# Patient Record
Sex: Male | Born: 1950 | Race: Black or African American | Hispanic: No | Marital: Married | Smoking: Current every day smoker
Health system: Southern US, Community
[De-identification: ages and names within clinical notes are randomized; demographics above are authoritative.]

## PROBLEM LIST (undated history)

## (undated) DIAGNOSIS — R7303 Prediabetes: Secondary | ICD-10-CM

---

## 2019-01-02 ENCOUNTER — Other Ambulatory Visit: Payer: Self-pay | Admitting: Family Medicine

## 2019-01-02 ENCOUNTER — Other Ambulatory Visit (HOSPITAL_COMMUNITY): Payer: Self-pay | Admitting: Family Medicine

## 2019-01-02 DIAGNOSIS — R911 Solitary pulmonary nodule: Secondary | ICD-10-CM

## 2019-01-09 ENCOUNTER — Other Ambulatory Visit: Payer: Self-pay

## 2019-01-09 ENCOUNTER — Ambulatory Visit (HOSPITAL_COMMUNITY): Payer: Medicare Other

## 2019-01-09 ENCOUNTER — Ambulatory Visit (HOSPITAL_COMMUNITY)
Admission: RE | Admit: 2019-01-09 | Discharge: 2019-01-09 | Disposition: A | Discharge status: Home / Self Care | Payer: Medicare Other | Source: Ambulatory Visit | Attending: Family Medicine | Admitting: Family Medicine

## 2019-01-09 DIAGNOSIS — R911 Solitary pulmonary nodule: Secondary | ICD-10-CM | POA: Diagnosis not present

## 2021-09-28 ENCOUNTER — Other Ambulatory Visit: Payer: Self-pay

## 2021-09-28 ENCOUNTER — Ambulatory Visit (INDEPENDENT_AMBULATORY_CARE_PROVIDER_SITE_OTHER): Payer: Medicare Other | Admitting: Emergency Medicine

## 2021-09-28 ENCOUNTER — Encounter: Payer: Self-pay | Admitting: Emergency Medicine

## 2021-09-28 DIAGNOSIS — Z720 Tobacco use: Secondary | ICD-10-CM

## 2021-09-28 DIAGNOSIS — R911 Solitary pulmonary nodule: Secondary | ICD-10-CM

## 2021-09-28 NOTE — Assessment & Plan Note (Signed)
6 mm right upper lobe pulmonary nodule that was seen 12/2018.  No follow-up has been done yet.  Plan for repeat CT now.  If it has evolved then would recommend navigational bronchoscopy to further evaluate.  Discussed this with him today.  If the nodule is stable then would transition him over into the lung cancer screening program.

## 2021-09-28 NOTE — Progress Notes (Signed)
Subjective:    Patient ID: Kevin Dixon, male    DOB: 07/07/1951, 71 y.o.   MRN: [id number]  HPI 71 year old active smoker (50 pack years) with a history of diabetes, hypertension, vitamin D deficiency, hyperlipidemia. No SOB, no wheeze. His wife says he coughs frequently. Occasional mucous. Never hemoptysis.   CT chest 01/09/2019 reviewed by me shows no mediastinal or hilar lymphadenopathy.  There was a 6 mm right upper lobe pulmonary nodule with an adjacent small 2 mm nodule.  No other nodule seen.  There was some reticulation, fibrotic change in the anterior left upper lobe with some associated bronchiectasis.   Review of Systems As per HPI  No past medical history on file.  Diabetes Vitamin D deficiency Hyperlipidemia Tobacco use  No family history on file.   Social History   Socioeconomic History   Marital status: Married    Spouse name: Not on file   Number of children: Not on file   Years of education: Not on file   Highest education level: Not on file  Occupational History   Not on file  Tobacco Use   Smoking status: Every Day    Packs/day: 0.50    Years: 30.00    Pack years: 15.00    Types: Cigarettes   Smokeless tobacco: Never   Tobacco comments:    1/2 pack smoking daily ARJ 09/28/21  Substance and Sexual Activity   Alcohol use: Not on file   Drug use: Not on file   Sexual activity: Not on file  Other Topics Concern   Not on file  Social History Narrative   Not on file   Social Determinants of Health   Financial Resource Strain: Not on file  Food Insecurity: Not on file  Transportation Needs: Not on file  Physical Activity: Not on file  Stress: Not on file  Social Connections: Not on file  Intimate Partner Violence: Not on file    Was in the [employer], [employer], no asbestos  Was in [place].  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  Never TB, no TB exposure.   Not on File   Outpatient Medications Prior to Visit   Medication Sig Dispense Refill   naproxen (NAPROSYN) 375 MG tablet SMARTSIG:1 Tablet(s) By Mouth Every 12 Hours PRN     No facility-administered medications prior to visit.         Objective:   Physical Exam Vitals:   09/28/21 1042  BP: 112/68  Pulse: 77  Temp: 98.3 F (36.8 C)  TempSrc: Oral  SpO2: 97%  Weight: 168 lb 6.4 oz (76.4 kg)  Height: 5\' 9"  (1.753 m)   Gen: Pleasant, thin, in no distress,  normal affect  ENT: No lesions,  mouth clear,  oropharynx clear, no postnasal drip  Neck: No JVD, no stridor  Lungs: No use of accessory muscles, inspiratory squeaks, no wheezes or crackles  Cardiovascular: RRR, heart sounds normal, no murmur or gallops, no peripheral edema  Musculoskeletal: No deformities, no cyanosis or clubbing  Neuro: alert, awake, non focal  Skin: Warm, no lesions or rash      Assessment & Plan:  Pulmonary nodule 6 mm right upper lobe pulmonary nodule that was seen 12/2018.  No follow-up has been done yet.  Plan for repeat CT now.  If it has evolved then would recommend navigational bronchoscopy to further evaluate.  Discussed this with him today.  If the nodule is stable then would transition him over into  the lung cancer screening program.  Tobacco use Discussed cessation with him today.  He understands that he needs to cut down ultimately quit.  He is in the contemplative phase At risk for COPD although he minimizes symptoms.  His wife states he does cough.  We will plan to get pulmonary function testing at some point going forward.  This may help guide [place] regarding bronchoscopy versus referral for primary resection depending on how the nodule behaves.   Levy Pupa, MD, PhD 09/28/2021, 11:05 AM [facility] [phone] or if no answer before 7:00PM call [phone] For any issues after 7:00PM please call eLink [phone]

## 2021-09-28 NOTE — Assessment & Plan Note (Addendum)
Discussed cessation with him today.  He understands that he needs to cut down ultimately quit.  He is in the contemplative phase At risk for COPD although he minimizes symptoms.  His wife states he does cough.  We will plan to get pulmonary function testing at some point going forward.  This may help guide [place] regarding bronchoscopy versus referral for primary resection depending on how the nodule behaves.

## 2021-09-28 NOTE — Patient Instructions (Signed)
We will plan to repeat your CT scan of the chest to compare with your prior.  We can do this at [facility] in [place]. You will probably benefit from having pulmonary function testing at some point in the future to look for any evidence of COPD. Work on decreasing your cigarettes.  Ultimate goal will be to stop altogether. Follow-up with Dr. Delton Coombes next available after your CT so we can review the results together.

## 2021-09-28 NOTE — Addendum Note (Signed)
Addended by: Dorisann Frames R on: 09/28/2021 11:57 AM   Modules accepted: Orders

## 2021-11-01 ENCOUNTER — Ambulatory Visit (HOSPITAL_COMMUNITY)
Admission: RE | Admit: 2021-11-01 | Discharge: 2021-11-01 | Disposition: A | Discharge status: Home / Self Care | Payer: Medicare Other | Source: Ambulatory Visit | Attending: Emergency Medicine | Admitting: Emergency Medicine

## 2021-11-01 ENCOUNTER — Other Ambulatory Visit: Payer: Self-pay

## 2021-11-01 DIAGNOSIS — R911 Solitary pulmonary nodule: Secondary | ICD-10-CM | POA: Diagnosis present

## 2021-11-02 ENCOUNTER — Encounter: Payer: Self-pay | Admitting: Emergency Medicine

## 2021-11-02 ENCOUNTER — Ambulatory Visit (INDEPENDENT_AMBULATORY_CARE_PROVIDER_SITE_OTHER): Payer: Medicare Other | Admitting: Emergency Medicine

## 2021-11-02 DIAGNOSIS — Z720 Tobacco use: Secondary | ICD-10-CM

## 2021-11-02 DIAGNOSIS — R911 Solitary pulmonary nodule: Secondary | ICD-10-CM | POA: Diagnosis not present

## 2021-11-02 NOTE — Addendum Note (Signed)
Addended by: Arlyss Repress on: 11/02/2021 11:57 AM ? ? Modules accepted: Orders ? ?

## 2021-11-02 NOTE — Patient Instructions (Signed)
We will plan to repeat your CT scan of the chest without contrast in 6 months.  If the radiology report deems that your nodule is unchanged then we will extend this out to 12 months as discussed today. ?We will hold off on pulmonary function testing or inhalers for now.  If your breathing changes then it would be reasonable for [place] to pursue trying an inhaler. ?Work on decreasing your cigarettes.  Ultimate goal would be to stop altogether. ?Follow with Dr. Delton Coombes in 6 months ?

## 2021-11-02 NOTE — Progress Notes (Signed)
? ?Subjective:  ? ? Patient ID: Kevin Dixon, male    DOB: 07-26-1951, 71 y.o.   MRN: [id number] ? ?HPI ?71 year old active smoker (50 pack years) with a history of diabetes, hypertension, vitamin D deficiency, hyperlipidemia. No SOB, no wheeze. His wife says he coughs frequently. Occasional mucous. Never hemoptysis.  ? ?CT chest 01/09/2019 reviewed by me shows no mediastinal or hilar lymphadenopathy.  There was a 6 mm right upper lobe pulmonary nodule with an adjacent small 2 mm nodule.  No other nodule seen.  There was some reticulation, fibrotic change in the anterior left upper lobe with some associated bronchiectasis. ? ?ROV 11/02/21 --follow-up visit for 71 year old gentleman with a history of active tobacco use.  I saw him 1 month ago for an abnormal CT scan of the chest done in May 2020.  Feels well, denies dyspnea, does not use albuterol and is not interested in pulmonary function testing or an everyday inhaler.  We repeated his CT scan of the chest to compare with 12/2018 as below. ? ?Super D CT chest 11/01/2021 reviewed by me, shows the right upper lobe nodule still present, appears to be grossly unchanged in size although there may be a small linear component present that I did not see on the previous scan from 12/2018.  Minimal interval change if any.  The radiology report is still pending ? ? ?Review of Systems ?As per HPI ? ?No past medical history on file.  ?Diabetes ?Vitamin D deficiency ?Hyperlipidemia ?Tobacco use ? ?No family history on file.  ? ?Social History  ? ?Socioeconomic History  ? Marital status: Married  ?  Spouse name: Not on file  ? Number of children: Not on file  ? Years of education: Not on file  ? Highest education level: Not on file  ?Occupational History  ? Not on file  ?Tobacco Use  ? Smoking status: Every Day  ?  Packs/day: 0.50  ?  Years: 30.00  ?  Pack years: 15.00  ?  Types: Cigarettes  ? Smokeless tobacco: Never  ? Tobacco comments:  ?  1/2 pack smoking daily ARJ 09/28/21   ?Substance and Sexual Activity  ? Alcohol use: Not on file  ? Drug use: Not on file  ? Sexual activity: Not on file  ?Other Topics Concern  ? Not on file  ?Social History Narrative  ? Not on file  ? ?Social Determinants of Health  ? ?Financial Resource Strain: Not on file  ?Food Insecurity: Not on file  ?Transportation Needs: Not on file  ?Physical Activity: Not on file  ?Stress: Not on file  ?Social Connections: Not on file  ?Intimate Partner Violence: Not on file  ?  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  ?Worked in [employer], and then ran [employer], did wear a mask.  ?Never TB, no TB exposure.  ? ?Not on File  ? ?Outpatient Medications Prior to Visit  ?Medication Sig Dispense Refill  ? naproxen (NAPROSYN) 375 MG tablet SMARTSIG:1 Tablet(s) By Mouth Every 12 Hours PRN (Patient not taking: Reported on 11/02/2021)    ? ?No facility-administered medications prior to visit.  ? ? ? ? ?   ?Objective:  ? Physical Exam ?Vitals:  ? 11/02/21 1119  ?BP: 122/62  ?Pulse: 68  ?Temp: 98.4 ?F (36.9 ?C)  ?TempSrc: Oral  ?SpO2: 99%  ?Weight: 172 lb 3.2 oz (78.1 kg)  ?Height: 5\' 9"  (1.753 m)  ? ?Gen: Pleasant, thin, in no distress,  normal  affect ? ?ENT: No lesions,  mouth clear,  oropharynx clear, no postnasal drip ? ?Neck: No JVD, no stridor ? ?Lungs: No use of accessory muscles, inspiratory squeaks, no wheezes or crackles ? ?Cardiovascular: RRR, heart sounds normal, no murmur or gallops, no peripheral edema ? ?Musculoskeletal: No deformities, no cyanosis or clubbing ? ?Neuro: alert, awake, non focal ? ?Skin: Warm, no lesions or rash ? ? ?   ?Assessment & Plan:  ?Pulmonary nodule ?CT from yesterday.  Radiology report still pending.  The nodule may be slightly larger, measures at 8.6 mm which is the same as 12/2018.  There is also a subtle linear component, spiculation present on the new scan that I do not see on the old scan.  I discussed possible get a PET scan with him although acknowledge that  sensitivity will be lowered due to the nodule size.  He wants to hold off on this.  He is willing to have a repeat CT.  I recommended 6 months.  He would like to extend it to 12 months if the radiology report confirms that the right upper lobe nodule is unchanged.  We can discuss this depending on the radiology interpretation. ? ?Tobacco use ?Needs cessation.  Discussed today.  He wants to hold off on PFT or bronchodilator therapy for now.  If we ultimately decide that he would potentially be a candidate for surgical resection of his pulmonary nodule and clearly he will need PFT.  If he develops symptoms then we can talk about PFT and bronchodilators. ? ? ?Levy Pupa, MD, PhD ?11/02/2021, 11:52 AM ?[facility] ?[phone] or if no answer before 7:00PM call [phone] ?For any issues after 7:00PM please call eLink [phone] ? ? ?

## 2021-11-02 NOTE — Assessment & Plan Note (Signed)
CT from yesterday.  Radiology report still pending.  The nodule may be slightly larger, measures at 8.6 mm which is the same as 12/2018.  There is also a subtle linear component, spiculation present on the new scan that I do not see on the old scan.  I discussed possible get a PET scan with him although acknowledge that sensitivity will be lowered due to the nodule size.  He wants to hold off on this.  He is willing to have a repeat CT.  I recommended 6 months.  He would like to extend it to 12 months if the radiology report confirms that the right upper lobe nodule is unchanged.  We can discuss this depending on the radiology interpretation. ?

## 2021-11-02 NOTE — Assessment & Plan Note (Signed)
Needs cessation.  Discussed today.  He wants to hold off on PFT or bronchodilator therapy for now.  If we ultimately decide that he would potentially be a candidate for surgical resection of his pulmonary nodule and clearly he will need PFT.  If he develops symptoms then we can talk about PFT and bronchodilators. ?

## 2021-11-09 ENCOUNTER — Telehealth: Payer: Self-pay | Admitting: Emergency Medicine

## 2021-11-09 NOTE — Telephone Encounter (Signed)
Called and spoke with patient's wife Corrie Dandy ([place]).  Corrie Dandy stated patient had CT 11/01/21 and Dr. Delton Coombes went over what he saw in chest CT, but was waiting on radiology report.  Corrie Dandy is wanting Dr. Kavin Leech results after seeing CT and reading radiology report. ? ? ?Message routed to Dr. Delton Coombes to advise ?

## 2021-11-09 NOTE — Telephone Encounter (Signed)
Patient's spouse, Mary(DPR) is aware of below results and voiced her understanding.  ?CT results faxed to PCP per [facility] results.  ?Nothing further needed.  ? ?

## 2021-11-09 NOTE — Telephone Encounter (Signed)
Please let them know that the radiologist agreed that the nodules looked stable. Agree with conservative management, follow up CT as we had planned.  ?

## 2022-04-28 ENCOUNTER — Ambulatory Visit (HOSPITAL_COMMUNITY): Payer: Medicare Other

## 2022-05-04 ENCOUNTER — Ambulatory Visit: Payer: Medicare Other | Admitting: Emergency Medicine

## 2022-05-05 ENCOUNTER — Ambulatory Visit (HOSPITAL_COMMUNITY): Payer: Medicare Other

## 2022-05-10 ENCOUNTER — Ambulatory Visit: Payer: Medicare Other | Admitting: Emergency Medicine

## 2022-05-11 ENCOUNTER — Emergency Department (HOSPITAL_COMMUNITY)
Admission: EM | Admit: 2022-05-11 | Discharge: 2022-05-11 | Disposition: A | Discharge status: Home / Self Care | Payer: Medicare Other | Attending: Emergency Medicine | Admitting: Emergency Medicine

## 2022-05-11 ENCOUNTER — Emergency Department (HOSPITAL_COMMUNITY): Payer: Medicare Other

## 2022-05-11 ENCOUNTER — Encounter (HOSPITAL_COMMUNITY): Payer: Self-pay | Admitting: *Deleted

## 2022-05-11 ENCOUNTER — Other Ambulatory Visit: Payer: Self-pay

## 2022-05-11 DIAGNOSIS — H538 Other visual disturbances: Secondary | ICD-10-CM | POA: Diagnosis not present

## 2022-05-11 DIAGNOSIS — D649 Anemia, unspecified: Secondary | ICD-10-CM

## 2022-05-11 DIAGNOSIS — Z7901 Long term (current) use of anticoagulants: Secondary | ICD-10-CM | POA: Diagnosis not present

## 2022-05-11 DIAGNOSIS — Z7982 Long term (current) use of aspirin: Secondary | ICD-10-CM | POA: Insufficient documentation

## 2022-05-11 DIAGNOSIS — Z79899 Other long term (current) drug therapy: Secondary | ICD-10-CM | POA: Insufficient documentation

## 2022-05-11 DIAGNOSIS — H5462 Unqualified visual loss, left eye, normal vision right eye: Secondary | ICD-10-CM

## 2022-05-11 HISTORY — DX: Prediabetes: R73.03

## 2022-05-11 LAB — CBC
HCT: 30.8 % — ABNORMAL LOW (ref 39.0–52.0)
Hemoglobin: 9.4 g/dL — ABNORMAL LOW (ref 13.0–17.0)
MCH: 25.4 pg — ABNORMAL LOW (ref 26.0–34.0)
MCHC: 30.5 g/dL (ref 30.0–36.0)
MCV: 83.2 fL (ref 80.0–100.0)
Platelets: 236 10*3/uL (ref 150–400)
RBC: 3.7 MIL/uL — ABNORMAL LOW (ref 4.22–5.81)
RDW: 19.7 % — ABNORMAL HIGH (ref 11.5–15.5)
WBC: 8.7 10*3/uL (ref 4.0–10.5)
nRBC: 0.2 % (ref 0.0–0.2)

## 2022-05-11 LAB — COMPREHENSIVE METABOLIC PANEL
ALT: 12 U/L (ref 0–44)
AST: 10 U/L — ABNORMAL LOW (ref 15–41)
Albumin: 3.2 g/dL — ABNORMAL LOW (ref 3.5–5.0)
Alkaline Phosphatase: 61 U/L (ref 38–126)
Anion gap: 8 (ref 5–15)
BUN: 21 mg/dL (ref 8–23)
CO2: 27 mmol/L (ref 22–32)
Calcium: 8.9 mg/dL (ref 8.9–10.3)
Chloride: 102 mmol/L (ref 98–111)
Creatinine, Ser: 0.91 mg/dL (ref 0.61–1.24)
GFR, Estimated: >60 mL/min (ref >60)
Glucose, Bld: 160 mg/dL — ABNORMAL HIGH (ref 70–99)
Potassium: 4.6 mmol/L (ref 3.5–5.1)
Sodium: 137 mmol/L (ref 135–145)
Total Bilirubin: 0.7 mg/dL (ref 0.3–1.2)
Total Protein: 8.1 g/dL (ref 6.5–8.1)

## 2022-05-11 LAB — URINALYSIS, ROUTINE W REFLEX MICROSCOPIC
Bacteria, UA: NONE SEEN
Bilirubin Urine: NEGATIVE
Glucose, UA: NEGATIVE mg/dL
Ketones, ur: NEGATIVE mg/dL
Leukocytes,Ua: NEGATIVE
Nitrite: NEGATIVE
Protein, ur: NEGATIVE mg/dL
Specific Gravity, Urine: 1.044 — ABNORMAL HIGH (ref 1.005–1.030)
pH: 5 (ref 5.0–8.0)

## 2022-05-11 LAB — APTT: aPTT: 115 seconds — ABNORMAL HIGH (ref 24–36)

## 2022-05-11 LAB — DIFFERENTIAL
Abs Immature Granulocytes: 0.16 10*3/uL — ABNORMAL HIGH (ref 0.00–0.07)
Basophils Absolute: 0.2 10*3/uL — ABNORMAL HIGH (ref 0.0–0.1)
Basophils Relative: 2 %
Eosinophils Absolute: 0.2 10*3/uL (ref 0.0–0.5)
Eosinophils Relative: 2 %
Immature Granulocytes: 2 %
Lymphocytes Relative: 19 %
Lymphs Abs: 1.7 10*3/uL (ref 0.7–4.0)
Monocytes Absolute: 1 10*3/uL (ref 0.1–1.0)
Monocytes Relative: 12 %
Neutro Abs: 5.5 10*3/uL (ref 1.7–7.7)
Neutrophils Relative %: 63 %

## 2022-05-11 LAB — PROTIME-INR
INR: 2 — ABNORMAL HIGH (ref 0.8–1.2)
Prothrombin Time: 22.7 seconds — ABNORMAL HIGH (ref 11.4–15.2)

## 2022-05-11 MED ORDER — STROKE: EARLY STAGES OF RECOVERY BOOK
Freq: Once | Status: DC
  Filled 2022-05-11: qty 1

## 2022-05-11 MED ORDER — SODIUM CHLORIDE 0.9 % IV BOLUS
500.0000 mL | Freq: Once | INTRAVENOUS | Status: AC
  Administered 2022-05-11: 500 mL via INTRAVENOUS

## 2022-05-11 MED ORDER — IOHEXOL 350 MG/ML SOLN
100.0000 mL | Freq: Once | INTRAVENOUS | Status: AC | PRN
  Administered 2022-05-11: 75 mL via INTRAVENOUS

## 2022-05-11 MED ORDER — SODIUM CHLORIDE 0.9 % IV SOLN
100.0000 mL/h | INTRAVENOUS | Status: DC
  Administered 2022-05-11: 100 mL/h via INTRAVENOUS

## 2022-05-11 MED ORDER — ASPIRIN 81 MG PO CHEW
81.0000 mg | CHEWABLE_TABLET | Freq: Every day | ORAL | Status: DC
  Administered 2022-05-11: 81 mg via ORAL
  Filled 2022-05-11: qty 1

## 2022-05-11 MED ORDER — CLOPIDOGREL BISULFATE 75 MG PO TABS: 75.0000 mg | ORAL_TABLET | Freq: Every day | ORAL | 0 refills | Status: AC

## 2022-05-11 MED ORDER — ASPIRIN 81 MG PO CHEW: 81.0000 mg | CHEWABLE_TABLET | Freq: Every day | ORAL | 0 refills | Status: DC

## 2022-05-11 MED ORDER — CLOPIDOGREL BISULFATE 75 MG PO TABS
75.0000 mg | ORAL_TABLET | Freq: Every day | ORAL | Status: DC
  Administered 2022-05-11: 75 mg via ORAL
  Filled 2022-05-11: qty 1

## 2022-05-11 NOTE — ED Provider Notes (Signed)
[facility] EMERGENCY DEPARTMENT Provider Note   CSN: [id number] Arrival date & time: 05/11/22  1358     History  Chief Complaint  Patient presents with  . Eye Problem    Kevin Dixon is a 71 y.o. male.   Eye Problem    Patient has history of pulmonary nodules, prediabetes and chronic tobacco use.  He presented to the ED for evaluation of decreased visual acuity out of his left eye.  Patient states he started having symptoms about 2 weeks ago.  They were gradually getting worse.  He went to see his eye doctor and they were concerned about possible stroke versus central retinal artery occlusion.  Patient denies any numbness or weakness.  No trouble with his speech.  At the eye doctors office patient did have ocular pressures that were normal.  They did note some swelling of his left upper and left lower eyelid.  Home Medications Prior to Admission medications   Medication Sig Start Date End Date Taking? Authorizing Provider  acetaminophen (TYLENOL) 650 MG CR tablet Take 650 mg by mouth every 8 (eight) hours as needed for pain.   Yes [provider]  aspirin 81 MG chewable tablet Chew 1 tablet (81 mg total) by mouth daily. 05/11/22  Yes Dorie Rank, MD  BEE POLLEN PO Take 1 tablet by mouth daily.   Yes [provider]  clopidogrel (PLAVIX) 75 MG tablet Take 1 tablet (75 mg total) by mouth daily. 05/11/22 06/10/22 Yes Dorie Rank, MD  ferrous sulfate 325 (65 FE) MG tablet Take 325 mg by mouth daily with breakfast.   Yes [provider]  ibuprofen (ADVIL) 200 MG tablet Take 200 mg by mouth every 6 (six) hours as needed.   Yes [provider]  naproxen (NAPROSYN) 375 MG tablet SMARTSIG:1 Tablet(s) By Mouth Every 12 Hours PRN Patient not taking: Reported on 11/02/2021 05/04/21   [provider]      Allergies    Patient has no known allergies.    Review of Systems   Review of Systems  Physical Exam Updated Vital Signs BP (!) 144/64    Pulse 68   Temp 98.4 F (36.9 C) (Oral)   Resp (!) 27   Ht 1.753 m (5\' 9" )   Wt 71.2 kg   SpO2 100%   BMI 23.18 kg/m  Physical Exam Vitals and nursing note reviewed.  Constitutional:      General: He is not in acute distress.    Appearance: He is well-developed.  HENT:     Head: Normocephalic and atraumatic.     Right Ear: External ear normal.     Left Ear: External ear normal.  Eyes:     General: No visual field deficit or scleral icterus.       Right eye: No discharge.        Left eye: No discharge.     Conjunctiva/sclera: Conjunctivae normal.     Comments: Mild periorbital edema left eye, no tenderness, extraocular is intact  Neck:     Trachea: No tracheal deviation.  Cardiovascular:     Rate and Rhythm: Normal rate and regular rhythm.  Pulmonary:     Effort: Pulmonary effort is normal. No respiratory distress.     Breath sounds: Normal breath sounds. No stridor. No wheezing or rales.  Abdominal:     General: Bowel sounds are normal. There is no distension.     Palpations: Abdomen is soft.     Tenderness: There is no  abdominal tenderness. There is no guarding or rebound.  Musculoskeletal:        General: No tenderness or deformity.     Cervical back: Neck supple.  Skin:    General: Skin is warm and dry.     Findings: No rash.  Neurological:     General: No focal deficit present.     Mental Status: He is alert and oriented to person, place, and time.     Cranial Nerves: No cranial nerve deficit (no facial droop, extraocular movements intact, no slurred speech), dysarthria or facial asymmetry.     Sensory: No sensory deficit.     Motor: No abnormal muscle tone, seizure activity or pronator drift.     Coordination: Coordination normal.     Comments:  able to hold both legs off bed for 5 seconds, sensation intact in all extremities,  no left or right sided neglect, normal finger-nose exam bilaterally, no nystagmus noted   Psychiatric:        Mood and Affect: Mood  normal.     ED Results / Procedures / Treatments   Labs (all labs ordered are listed, but only abnormal results are displayed) Labs Reviewed  PROTIME-INR - Abnormal; Notable for the following components:      Result Value   Prothrombin Time 22.7 (*)    INR 2.0 (*)    All other components within normal limits  APTT - Abnormal; Notable for the following components:   aPTT 115 (*)    All other components within normal limits  CBC - Abnormal; Notable for the following components:   RBC 3.70 (*)    Hemoglobin 9.4 (*)    HCT 30.8 (*)    MCH 25.4 (*)    RDW 19.7 (*)    All other components within normal limits  DIFFERENTIAL - Abnormal; Notable for the following components:   Basophils Absolute 0.2 (*)    Abs Immature Granulocytes 0.16 (*)    All other components within normal limits  COMPREHENSIVE METABOLIC PANEL - Abnormal; Notable for the following components:   Glucose, Bld 160 (*)    Albumin 3.2 (*)    AST 10 (*)    All other components within normal limits  URINALYSIS, ROUTINE W REFLEX MICROSCOPIC - Abnormal; Notable for the following components:   Specific Gravity, Urine 1.044 (*)    Hgb urine dipstick SMALL (*)    All other components within normal limits  LIPID PANEL  HEMOGLOBIN A1C  I-STAT CHEM 8, ED    EKG EKG Interpretation  Date/Time:  Wednesday May 11 2022 16:38:24 EDT Ventricular Rate:  63 PR Interval:  130 QRS Duration: 80 QT Interval:  408 QTC Calculation: 417 R Axis:   -5 Text Interpretation: Normal sinus rhythm Nonspecific T wave abnormality Abnormal ECG No previous ECGs available Confirmed by Linwood Dibbles [phone]) on 05/11/2022 4:50:45 PM  Radiology CT ANGIO HEAD NECK W WO CM  Result Date: 05/11/2022 CLINICAL DATA:  Acute neuro deficit.  Left-sided vision loss EXAM: CT ANGIOGRAPHY HEAD AND NECK TECHNIQUE: Multidetector CT imaging of the head and neck was performed using the standard protocol during bolus administration of intravenous contrast.  Multiplanar CT image reconstructions and MIPs were obtained to evaluate the vascular anatomy. Carotid stenosis measurements (when applicable) are obtained utilizing NASCET criteria, using the distal internal carotid diameter as the denominator. RADIATION DOSE REDUCTION: This exam was performed according to the departmental dose-optimization program which includes automated exposure control, adjustment of the mA and/or kV according to  patient size and/or use of iterative reconstruction technique. CONTRAST:  52mL OMNIPAQUE IOHEXOL 350 MG/ML SOLN COMPARISON:  None Available. FINDINGS: CT HEAD FINDINGS Brain: No evidence of acute infarction, hemorrhage, hydrocephalus, extra-axial collection or mass lesion/mass effect. Vascular: Negative for hyperdense vessel Skull: Negative Sinuses/Orbits: Mild mucosal edema paranasal sinuses. Negative orbit Other: None Review of the MIP images confirms the above findings CTA NECK FINDINGS Aortic arch: Limited imaging of the aortic arch. Proximal great vessels widely patent Right carotid system: Right carotid widely patent without atherosclerotic disease or stenosis Left carotid system: Mild atherosclerotic disease left common carotid artery and left carotid bifurcation without stenosis Vertebral arteries: Both vertebral arteries widely patent to the skull base without stenosis Skeleton: Cervical spondylosis.  No acute abnormality Other neck: Negative for mass or adenopathy in the neck. Upper chest: Lung apices clear bilaterally Review of the MIP images confirms the above findings CTA HEAD FINDINGS Anterior circulation: Internal carotid artery widely patent through the skull base and cavernous segment. Mild atherosclerotic calcification in the cavernous carotid bilaterally. Anterior and middle cerebral arteries patent without stenosis or aneurysm. Posterior circulation: Both vertebral arteries patent to the basilar without stenosis. PICA patent bilaterally. Basilar widely patent.  Superior cerebellar and posterior cerebral arteries patent bilaterally. Negative for aneurysm Venous sinuses: Normal venous enhancement Anatomic variants: None Review of the MIP images confirms the above findings IMPRESSION: 1. Negative CT head. Negative for intracranial large vessel occlusion. 2. Mild atherosclerotic disease left carotid bifurcation and cavernous carotid bilaterally. No significant carotid or vertebral artery stenosis in the neck. Electronically Signed   By: Marlan Palau M.D.   On: 05/11/2022 17:51    Procedures Procedures    Medications Ordered in ED Medications  sodium chloride 0.9 % bolus 500 mL (0 mLs Intravenous Stopped 05/11/22 1843)    Followed by  0.9 %  sodium chloride infusion (100 mL/hr Intravenous New Bag/Given 05/11/22 1842)   stroke: early stages of recovery book (has no administration in time range)  aspirin chewable tablet 81 mg (81 mg Oral Given 05/11/22 1938)  clopidogrel (PLAVIX) tablet 75 mg (75 mg Oral Given 05/11/22 1938)  iohexol (OMNIPAQUE) 350 MG/ML injection 100 mL (75 mLs Intravenous Contrast Given 05/11/22 1733)    ED Course/ Medical Decision Making/ A&P Clinical Course as of 05/11/22 1952  Wed May 11, 2022  1949 CBC(!) Anemia noted.  No old labs for comparison [JK]  1949 Comprehensive metabolic panel(!) Metabolic panel unremarkable [JK]    Clinical Course User Index [JK] Linwood Dibbles, MD                           Medical Decision Making Problems Addressed: Vision loss of left eye: acute illness or injury that poses a threat to life or bodily functions  Amount and/or Complexity of Data Reviewed Labs: ordered. Decision-making details documented in ED Course. Radiology: ordered. Discussion of management or test interpretation with external provider(s): Case discussed with Dr. Selina Cooley neuro hospitalist.  She recommends admission to the hospital for further stroke work-up.  Findings are concerning for central retinal artery occlusion  Risk OTC  drugs. Prescription drug management. Decision regarding hospitalization.   Patient's presentation is concerning for the possibility of central retinal artery occlusion.  Stroke was also concern.  Patient's labs are notable for anemia.  Patient denies any rectal bleeding.  I do not have any old labs for comparison.  Primary concern however with the acute vision loss.  Patient was seen by  ophtho today.  He does not have any evidence of glaucoma.  They did not see any acute retinal findings.  CT angio does not show any acute abnormality.  However discussed the case with neurology and the recommendation is for admission to the hospital for stroke work-up, further work-up entral retinal artery occlusion.  Discussed these findings with both the patient and his wife.  Explained my concern to him about him potentially losing vision in his eye if the symptoms were to get worse.  Patient understands this.  He states he does not want to be admitted to the hospital.  He feels like his symptoms are getting better.  He understands that both I and the neurologist recommend he stay in the hospital for further evaluation.  Patient agrees to outpatient follow-up.         Final Clinical Impression(s) / ED Diagnoses Final diagnoses:  Vision loss of left eye    Rx / DC Orders ED Discharge Orders          Ordered    aspirin 81 MG chewable tablet  Daily        05/11/22 1948    clopidogrel (PLAVIX) 75 MG tablet  Daily        05/11/22 1948              Linwood Dibbles, MD 05/11/22 1952

## 2022-05-11 NOTE — Discharge Instructions (Addendum)
Your test today were concerning for possible blockage in the blood vessel to your eye.  This can be associated with a condition that could lead to permanent vision loss.  As we discussed, the neurologist recommended admission to the hospital for further evaluation.  Please follow-up with a neurologist or your primary doctor to discussed further testing.  Return to the emergency room if you change your mind for further evaluation.  Please start taking the aspirin and Plavix medication in the meantime to help prevent blood clot formation in the blood vessels in your brain MRI

## 2022-05-11 NOTE — ED Triage Notes (Signed)
Pt vision loss to left eye couple days ago.  Pressure to left eye 2 weeks ago. Pt seen eye doctor this morning and sent here.  Denies any numbness or weakness to one side or slurred speech.  Monday -Wednesday had some N/V

## 2022-05-11 NOTE — ED Provider Triage Note (Signed)
Emergency Medicine Provider Triage Evaluation Note  Zafar Debrosse , a 71 y.o. male  was evaluated in triage.  Pt complains of vision changes left eye.  Went to the eye doctor.  Sent to the ED for stroke workup. .  Review of Systems  Positive: Vision loss  Negative: Speech issues, weaknes  Physical Exam  BP (!) 134/59 (BP Location: Right Arm)   Pulse 77   Temp 98.4 F (36.9 C) (Oral)   Resp 16   Ht 1.753 m (5\' 9" )   Wt 71.2 kg   SpO2 98%   BMI 23.18 kg/m  Gen:   Awake, no distress   Resp:  Normal effort  MSK:   Moves extremities without difficulty  Other:  Left eye proptotic  Medical Decision Making  Medically screening exam initiated at 3:17 PM.  Appropriate orders placed.  Oryon Gary was informed that the remainder of the evaluation will be completed by another provider, this initial triage assessment does not replace that evaluation, and the importance of remaining in the ED until their evaluation is complete.     Dorie Rank, MD 05/11/22 [phone]

## 2022-05-11 NOTE — Plan of Care (Signed)
Neurology plan of care  Contacted by [facility] EDP about this 71 yo patient with hx prediabetes referred from his eye doctor 2/2 concern for L CRAO after patient presented with vision loss in all quadrants of L eye. Pupils equal and reactive. L ptosis and chemosis noted on EDP exam. CTA H&N unremarkable.  Recommendations: - Admit for workup of probably CRAO (stroke equiv) - Permissive HTN x48 hrs from sx onset or until stroke ruled out by MRI goal BP <220/110. PRN labetalol or hydralazine if BP above these parameters. Avoid oral antihypertensives. - MRI brain and orbits with and without contrast - TTE  - Check A1c and LDL + add statin per guidelines - ASA 81mg  daily + plavix 75mg  daily x21 days f/b ASA 81mg  daily monotherapy after that - q4 hr neuro checks - STAT head CT for any change in neuro exam - Tele - PT/OT/SLP - Stroke education - Amb referral to neurology upon discharge - Please place routine teleneurology consult to Dr. Hortense Ramal (under neurology in [facility]) tomorrow AM for further guidance.  Su Monks, MD [facility] [phone]  If 7pm- 7am, please page neurology on call as listed in [place].

## 2022-05-11 NOTE — ED Notes (Signed)
ED Provider at bedside. 

## 2022-05-12 ENCOUNTER — Encounter: Payer: Self-pay | Admitting: Neurology

## 2022-05-12 LAB — HEMOGLOBIN A1C
Hgb A1c MFr Bld: 7 % — ABNORMAL HIGH (ref 4.8–5.6)
Mean Plasma Glucose: 154.2 mg/dL

## 2022-05-19 NOTE — Progress Notes (Signed)
NEUROLOGY CONSULTATION NOTE  Kevin Dixon MRN: [id number] DOB: 1950-08-26  Referring provider: Linwood Dibbles, MD (ED referral) Primary care provider: Romeo Rabon, MD  Reason for consult:  possible stroke, visual disturbance  Assessment/Plan:   Vision loss in left eye - unclear if retinal artery occlusion.  Eye exam reportedly unrevealing.  I would suspect small vessel disease vs embolic event Prediabetes Tobacco abuse Conjunctival injection and periorbital edema of right eye  Due to patient hesitancy to complete full workup, will order what I think are essential testing.  Will defer MRI of brain as it will not change management: 1  Check 2D echocardiogram and 2 week Zio monitoring 2  Check sed rate and CRP 3  Recommend starting ASA 81mg  daily 4  PCP should optimize glycemic control and cholesterol management (LDL goal less than 70) 5  Tobacco cessation 6  Obtain records from his ophthalmologist 7.  Follow up with ophthalmology regarding right eye edema.  8. Further recommendations pending results.     Subjective:  Kevin Dixon is a 71 year old male with prediabetes, tobacco abuse and pulmonary nodules who presents for visual disturbance.  History supplemented by ED note.  About a month ago, he started noticing decreased vision out of his left eye, described as black out of vision.  Over the next two weeks, it progressively got worse.  On 9/27, he saw his eye doctor who didn't find any abnormality on exam but was concerned about possible stroke or central retinal artery occlusion.  Mild periorbital edema was noted.  No acute retinal abnormalities were seen and ocular pressures were normal.  He was sent to the ED for further evaluation.  Denies headache, ocular pain, slurred speech, or unilateral numbness or weakness.  CT head personally reviewed was negative for acute abnormality.  CTA of head and neck personally reviewed revealed mild atherosclerotic disease in the left  carotid bifurcation and cavernous carotid bilaterally but no intracranial and extracranial large vessel occlusion or hemodynamically significant stenosis.  Labs revealed elevated PT (22.7) and INR (2.0), anemia (Hgb 9.4, HCT 30.8, MCV 83.2) and Hgb A1c 7.0.  Hospital admission was recommended but patient declined and requested outpatient neurology follow up.  He was supposed to take ASA and Plavix but stopped after 3 days.  He has since developed swelling and redness of his right eye.    PAST MEDICAL HISTORY: Past Medical History:  Diagnosis Date   Prediabetes     PAST SURGICAL HISTORY: No past surgical history on file.  MEDICATIONS: Current Outpatient Medications on File Prior to Visit  Medication Sig Dispense Refill   acetaminophen (TYLENOL) 650 MG CR tablet Take 650 mg by mouth every 8 (eight) hours as needed for pain.     aspirin 81 MG chewable tablet Chew 1 tablet (81 mg total) by mouth daily. 30 tablet 0   BEE POLLEN PO Take 1 tablet by mouth daily.     clopidogrel (PLAVIX) 75 MG tablet Take 1 tablet (75 mg total) by mouth daily. 30 tablet 0   ferrous sulfate 325 (65 FE) MG tablet Take 325 mg by mouth daily with breakfast.     ibuprofen (ADVIL) 200 MG tablet Take 200 mg by mouth every 6 (six) hours as needed.     naproxen (NAPROSYN) 375 MG tablet SMARTSIG:1 Tablet(s) By Mouth Every 12 Hours PRN (Patient not taking: Reported on 11/02/2021)     No current facility-administered medications on file prior to visit.    ALLERGIES: No Known Allergies  FAMILY HISTORY: No family history on file.  Objective:  Blood pressure 135/73, pulse 72, height 5\' 9"  (1.753 m), weight 156 lb 12.8 oz (71.1 kg), SpO2 100 %. General: No acute distress.  Patient appears well-groomed.   Head:  Normocephalic/atraumatic Eyes:  Right periorbital edema, conjunctival injection and lacrimation. Neck: supple, no paraspinal tenderness, full range of motion Back: No paraspinal tenderness Heart: regular rate  and rhythm Lungs: Clear to auscultation bilaterally. Vascular: No carotid bruits. Neurological Exam: Mental status: alert and oriented to person, place, and time, speech fluent and not dysarthric, language intact. Cranial nerves: CN I: not tested CN II: pupils equal, round and reactive to light, visual fields intact CN III, IV, VI:  full range of motion, no nystagmus, no ptosis CN V: facial sensation intact. CN VII: upper and lower face symmetric CN VIII: hearing intact CN IX, X: gag intact, uvula midline CN XI: sternocleidomastoid and trapezius muscles intact CN XII: tongue midline Bulk & Tone: normal, no fasciculations. Motor:  muscle strength 5/5 throughout Sensation:  Pinprick, temperature and vibratory sensation intact. Deep Tendon Reflexes:  2+ throughout,  toes downgoing.   Finger to nose testing:  Without dysmetria.   Heel to shin:  Without dysmetria.   Gait:  Normal station and stride.  Romberg negative.    Thank you for allowing me to take part in the care of this patient.  Metta Clines, DO  CC: Moshe Cipro, MD

## 2022-05-23 ENCOUNTER — Ambulatory Visit (INDEPENDENT_AMBULATORY_CARE_PROVIDER_SITE_OTHER): Payer: Medicare Other | Admitting: Neurology

## 2022-05-23 ENCOUNTER — Encounter: Payer: Self-pay | Admitting: Neurology

## 2022-05-23 ENCOUNTER — Other Ambulatory Visit (INDEPENDENT_AMBULATORY_CARE_PROVIDER_SITE_OTHER): Payer: Medicare Other

## 2022-05-23 VITALS — BP 135/73 | HR 72 | Ht 69.0 in | Wt 156.8 lb

## 2022-05-23 DIAGNOSIS — Z720 Tobacco use: Secondary | ICD-10-CM

## 2022-05-23 DIAGNOSIS — H5462 Unqualified visual loss, left eye, normal vision right eye: Secondary | ICD-10-CM

## 2022-05-23 DIAGNOSIS — H3412 Central retinal artery occlusion, left eye: Secondary | ICD-10-CM | POA: Diagnosis not present

## 2022-05-23 DIAGNOSIS — R7303 Prediabetes: Secondary | ICD-10-CM

## 2022-05-23 DIAGNOSIS — R600 Localized edema: Secondary | ICD-10-CM

## 2022-05-23 LAB — SEDIMENTATION RATE: Sed Rate: 116 mm/hr — ABNORMAL HIGH (ref 0–20)

## 2022-05-23 LAB — C-REACTIVE PROTEIN: CRP: 5.7 mg/dL (ref 0.5–20.0)

## 2022-05-23 NOTE — Patient Instructions (Signed)
Check echocardiogram Check 2 week Zio patch heart monitor Check labs:  sed rate, CRP Start aspirin 81mg  daily Further recommendations pending results.  Otherwise, follow up 6 months or as needed.

## 2022-05-27 ENCOUNTER — Telehealth: Payer: Self-pay | Admitting: Neurology

## 2022-05-27 DIAGNOSIS — M316 Other giant cell arteritis: Secondary | ICD-10-CM

## 2022-05-27 NOTE — Telephone Encounter (Signed)
Pts wife is calling to see about the appt for his temple biopsy. She said he mentioned that with the cardiovascular. She has not heard anything.

## 2022-05-30 NOTE — Telephone Encounter (Signed)
Per Lab note from [facility], The test for inflammation is high.  As we discussed, this may mean inflammation of an artery in his temple which can potentially cause permanent vision loss.  As we discussed, the next step would be a left temporal artery biopsy.  If positive, we would need to start prednisone.     Referral sent.

## 2022-06-13 ENCOUNTER — Ambulatory Visit (HOSPITAL_COMMUNITY)
Admission: RE | Admit: 2022-06-13 | Discharge: 2022-06-13 | Disposition: A | Discharge status: Home / Self Care | Payer: Medicare Other | Source: Ambulatory Visit | Attending: Emergency Medicine | Admitting: Emergency Medicine

## 2022-06-13 DIAGNOSIS — R911 Solitary pulmonary nodule: Secondary | ICD-10-CM | POA: Diagnosis present

## 2022-06-14 ENCOUNTER — Ambulatory Visit: Payer: Medicare Other | Admitting: Cardiology

## 2022-06-21 ENCOUNTER — Encounter: Payer: Self-pay | Admitting: Emergency Medicine

## 2022-06-21 ENCOUNTER — Ambulatory Visit (INDEPENDENT_AMBULATORY_CARE_PROVIDER_SITE_OTHER): Payer: Medicare Other | Admitting: Emergency Medicine

## 2022-06-21 VITALS — BP 126/68 | HR 84 | Temp 98.1°F | Ht 69.0 in | Wt 149.2 lb

## 2022-06-21 DIAGNOSIS — R911 Solitary pulmonary nodule: Secondary | ICD-10-CM | POA: Diagnosis not present

## 2022-06-21 DIAGNOSIS — Z720 Tobacco use: Secondary | ICD-10-CM | POA: Diagnosis not present

## 2022-06-21 NOTE — Addendum Note (Signed)
Addended by: Gavin Potters R on: 06/21/2022 04:28 PM   Modules accepted: Orders

## 2022-06-21 NOTE — Assessment & Plan Note (Signed)
Reviewed his CT scan of the chest.  Pulmonary nodules are stable in size and appearance.  He does have some associated changes that are consistent with possible respiratory bronchiolitis.  He continues to smoke.  He does not need any more dedicated follow-up of these nodules but he would benefit from getting into the lung cancer screening program.  I talked him about this today and he agrees.  For scan to be done and October 2024.

## 2022-06-21 NOTE — Patient Instructions (Signed)
We reviewed your CT scan of the chest today.  This is stable without any interval change in pulmonary nodules.  Good news. We will arrange for a lung cancer screening CT scan of your chest in October 2024 to be done at [facility]. You would benefit from decreasing your cigarettes.  Ultimate goal would be to stop altogether. Follow with Dr. Currie Paris to discuss your weight loss Follow Dr. Lamonte Sakai in October 2024 after your CT scan so we can review the results together.

## 2022-06-21 NOTE — Progress Notes (Signed)
Subjective:    Patient ID: Kevin Dixon, male    DOB: [birth year], 71 y.o.   MRN: [id number]  HPI  ROV 11/02/21 --follow-up visit for 71 year old gentleman with a history of active tobacco use.  I saw him 1 month ago for an abnormal CT scan of the chest done in May 2020.  Feels well, denies dyspnea, does not use albuterol and is not interested in pulmonary function testing or an everyday inhaler.  We repeated his CT scan of the chest to compare with 12/2018 as below.  Super D CT chest 11/01/2021 reviewed by me, shows the right upper lobe nodule still present, appears to be grossly unchanged in size although there may be a small linear component present that I did not see on the previous scan from 12/2018.  Minimal interval change if any.  The radiology report is still pending   ROV 06/21/2022 --follow-up visit 70 year old gentleman with history of tobacco use.  I have been following a right upper lobe pulmonary nodule with serial imaging.  Has been stable in size.  We repeated his CT chest 06/13/2022. He reports that he is smoking about 10 cig a a day. No SOB, no resp sx.   CT scan of the chest 06/13/2022 reviewed by me, shows stable right upper lobe pulmonary nodules unchanged going back to 12/2018.  There is some stable septal thickening and some cystic changes in the upper lobes with some generalized groundglass, question respiratory bronchiolitis.   Review of Systems As per HPI  Past Medical History:  Diagnosis Date   Prediabetes     Diabetes Vitamin D deficiency Hyperlipidemia Tobacco use  No family history on file.   Social History   Socioeconomic History   Marital status: Married    Spouse name: Not on file   Number of children: Not on file   Years of education: Not on file   Highest education level: Not on file  Occupational History   Not on file  Tobacco Use   Smoking status: Every Day    Packs/day: 0.50    Years: 30.00    Total pack years: 15.00    Types:  Cigarettes   Smokeless tobacco: Never   Tobacco comments:    1/2 pack smoking daily ARJ 06/21/22  Substance and Sexual Activity   Alcohol use: Yes    Comment: occ/Rare0r   Drug use: Never   Sexual activity: Not on file  Other Topics Concern   Not on file  Social History Narrative   Not on file   Social Determinants of Health   Financial Resource Strain: Not on file  Food Insecurity: Not on file  Transportation Needs: Not on file  Physical Activity: Not on file  Stress: Not on file  Social Connections: Not on file  Intimate Partner Violence: Not on file    Was in the [employer], Dealer, 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  Worked in [employer], and then ran [employer], did wear a mask.  Never TB, no TB exposure.   No Known Allergies   Outpatient Medications Prior to Visit  Medication Sig Dispense Refill   acetaminophen (TYLENOL) 650 MG CR tablet Take 650 mg by mouth every 8 (eight) hours as needed for pain.     Apoaequorin (PREVAGEN PO) Take by mouth.     aspirin 81 MG chewable tablet Chew 1 tablet (81 mg total) by mouth daily. 30 tablet 0   BEE POLLEN PO Take 1 tablet by mouth daily.  ferrous sulfate 325 (65 FE) MG tablet Take 325 mg by mouth daily with breakfast.     ibuprofen (ADVIL) 200 MG tablet Take 200 mg by mouth every 6 (six) hours as needed.     naproxen (NAPROSYN) 375 MG tablet      No facility-administered medications prior to visit.         Objective:   Physical Exam Vitals:   06/21/22 1552  BP: 126/68  Pulse: 84  Temp: 98.1 F (36.7 C)  TempSrc: Oral  SpO2: 98%  Weight: 149 lb 3.2 oz (67.7 kg)  Height: 5\' 9"  (1.753 m)   Gen: Pleasant, thin, in no distress,  normal affect  ENT: No lesions,  mouth clear,  oropharynx clear, no postnasal drip  Neck: No JVD, no stridor  Lungs: No use of accessory muscles, inspiratory squeaks, no wheezes or crackles  Cardiovascular: RRR, heart sounds normal, no murmur or gallops, no peripheral  edema  Musculoskeletal: No deformities, no cyanosis or clubbing  Neuro: alert, awake, non focal  Skin: Warm, no lesions or rash      Assessment & Plan:  Pulmonary nodule Reviewed his CT scan of the chest.  Pulmonary nodules are stable in size and appearance.  He does have some associated changes that are consistent with possible respiratory bronchiolitis.  He continues to smoke.  He does not need any more dedicated follow-up of these nodules but he would benefit from getting into the lung cancer screening program.  I talked him about this today and he agrees.  For scan to be done and October 2024.  Tobacco use Discussed smoking cessation with him today.  He does not have any respiratory symptoms.  Does not need to be on any bronchodilators at this time.   November 2024, MD, PhD 06/21/2022, 4:09 PM [facility] [phone] or if no answer before 7:00PM call [phone] For any issues after 7:00PM please call eLink [phone]

## 2022-06-21 NOTE — Assessment & Plan Note (Signed)
Discussed smoking cessation with him today.  He does not have any respiratory symptoms.  Does not need to be on any bronchodilators at this time.

## 2022-06-30 ENCOUNTER — Ambulatory Visit: Payer: Medicare Other

## 2022-06-30 ENCOUNTER — Encounter: Payer: Self-pay | Admitting: Internal Medicine

## 2022-06-30 ENCOUNTER — Ambulatory Visit: Payer: Medicare Other | Admitting: Internal Medicine

## 2022-06-30 VITALS — BP 107/63 | HR 93 | Ht 69.0 in | Wt 149.8 lb

## 2022-06-30 DIAGNOSIS — H3412 Central retinal artery occlusion, left eye: Secondary | ICD-10-CM

## 2022-06-30 DIAGNOSIS — I10 Essential (primary) hypertension: Secondary | ICD-10-CM | POA: Insufficient documentation

## 2022-06-30 DIAGNOSIS — E782 Mixed hyperlipidemia: Secondary | ICD-10-CM

## 2022-06-30 MED ORDER — ROSUVASTATIN CALCIUM 20 MG PO TABS: 20.0000 mg | ORAL_TABLET | Freq: Every day | ORAL | 3 refills | Status: AC

## 2022-06-30 NOTE — Progress Notes (Signed)
Primary Physician/Referring:  Moshe Cipro, MD  Patient ID: Kevin Dixon, male    DOB: 04-Apr-1951, 71 y.o.   MRN: [id number]  Chief Complaint  Patient presents with   central retinal artery occlusion of left eye   New Patient (Initial Visit)   HPI:    Kevin Dixon  is a 71 y.o. male with past medical history significant for hypertension and prediabetes who is here to establish care with cardiology after experiencing acute visual loss in his left eye.  Patient had complete blackout of his vision in that eye and it lasted about 2 weeks.  He saw his eye doctor who was concerned for possible stroke or retinal artery occlusion but nothing was found on CT scan of the head.  Patient then did follow-up with neurology who recommended a cardiac consultation, specifically echocardiogram and event monitor.  Upon bringing this up to the patient, he became quite belligerent and aggressive.  He kept repeating that he does not need these tests and why it should he get them.  Patient continued to be argumentative and yell about testing that he believes is are not necessary.  He says that he does not need to be here and his heart is fluttering and he is leaving.  Patient then proceeded to leave the office without scheduling any testing or follow-up exam.    Past Medical History:  Diagnosis Date   Prediabetes    History reviewed. No pertinent surgical history. History reviewed. No pertinent family history.  Social History   Tobacco Use   Smoking status: Every Day    Packs/day: 0.50    Years: 30.00    Total pack years: 15.00    Types: Cigarettes   Smokeless tobacco: Never   Tobacco comments:    1/2 pack smoking daily ARJ 06/21/22  Substance Use Topics   Alcohol use: Yes    Comment: occ/Rare0r   Marital Status: Married  ROS  Review of Systems  Eyes:  Positive for vision loss in left eye, visual disturbance and visual halos.   Objective  Blood pressure 107/63, pulse 93, height 5\' 9"   (1.753 m), weight 149 lb 12.8 oz (67.9 kg), SpO2 97 %. Body mass index is 22.12 kg/m.     06/30/2022   10:27 AM 06/21/2022    3:52 PM 05/23/2022    1:28 PM  Vitals with BMI  Height 5\' 9"  5\' 9"  5\' 9"   Weight 149 lbs 13 oz 149 lbs 3 oz 156 lbs 13 oz  BMI 22.11 [id number]  Systolic [id number]  Diastolic 63 68 73  Pulse 93 84 72     Physical Exam Not performed - patient was aggressive and left appointment  Medications and allergies  No Known Allergies   Medication list after today's encounter   Current Outpatient Medications:    acetaminophen (TYLENOL) 650 MG CR tablet, Take 650 mg by mouth every 8 (eight) hours as needed for pain., Disp: , Rfl:    Apoaequorin (PREVAGEN PO), Take by mouth., Disp: , Rfl:    BEE POLLEN PO, Take 1 tablet by mouth daily., Disp: , Rfl:    ferrous sulfate 325 (65 FE) MG tablet, Take 325 mg by mouth daily with breakfast., Disp: , Rfl:    ibuprofen (ADVIL) 200 MG tablet, Take 200 mg by mouth every 6 (six) hours as needed., Disp: , Rfl:    rosuvastatin (CRESTOR) 20 MG tablet, Take 1 tablet (20 mg total) by mouth daily., Disp: 90 tablet, Rfl: 3  aspirin 81 MG chewable tablet, Chew 1 tablet (81 mg total) by mouth daily. (Patient not taking: Reported on 06/30/2022), Disp: 30 tablet, Rfl: 0   naproxen (NAPROSYN) 375 MG tablet, , Disp: , Rfl:   Laboratory examination:   Lab Results  Component Value Date   NA 137 05/11/2022   K 4.6 05/11/2022   CO2 27 05/11/2022   GLUCOSE 160 (H) 05/11/2022   BUN 21 05/11/2022   CREATININE 0.91 05/11/2022   CALCIUM 8.9 05/11/2022   GFRNONAA >60 05/11/2022       Latest Ref Rng & Units 05/11/2022    3:37 PM  CMP  Glucose 70 - 99 mg/dL 160   BUN 8 - 23 mg/dL 21   Creatinine 0.61 - 1.24 mg/dL 0.91   Sodium 135 - 145 mmol/L 137   Potassium 3.5 - 5.1 mmol/L 4.6   Chloride 98 - 111 mmol/L 102   CO2 22 - 32 mmol/L 27   Calcium 8.9 - 10.3 mg/dL 8.9   Total Protein 6.5 - 8.1 g/dL 8.1   Total Bilirubin 0.3 - 1.2 mg/dL  0.7   Alkaline Phos 38 - 126 U/L 61   AST 15 - 41 U/L 10   ALT 0 - 44 U/L 12       Latest Ref Rng & Units 05/11/2022    3:37 PM  CBC  WBC 4.0 - 10.5 K/uL 8.7   Hemoglobin 13.0 - 17.0 g/dL 9.4   Hematocrit 39.0 - 52.0 % 30.8   Platelets 150 - 400 K/uL 236     Lipid Panel No results for input(s): "CHOL", "TRIG", "LDLCALC", "VLDL", "HDL", "CHOLHDL", "LDLDIRECT" in the last 8760 hours.  HEMOGLOBIN A1C Lab Results  Component Value Date   HGBA1C 7.0 (H) 05/11/2022   MPG 154.2 05/11/2022   TSH No results for input(s): "TSH" in the last 8760 hours.  External labs:     Radiology:   05/11/2022 IMPRESSION: 1. Negative CT head. Negative for intracranial large vessel occlusion. 2. Mild atherosclerotic disease left carotid bifurcation and cavernous carotid bilaterally. No significant carotid or vertebral artery stenosis in the neck.  Cardiac Studies:   No results found for this or any previous visit from the past 1095 days.     No results found for this or any previous visit from the past 1095 days.     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 Essential hypertension  I10     3. Mixed hyperlipidemia  E78.2        Orders Placed This Encounter  Procedures   EKG 12-Lead    Meds ordered this encounter  Medications   rosuvastatin (CRESTOR) 20 MG tablet    Sig: Take 1 tablet (20 mg total) by mouth daily.    Dispense:  90 tablet    Refill:  3    There are no discontinued medications.   Recommendations:   Ge Slaski is a 71 y.o.  male with sudden visual loss in his left eye  Central retinal artery occlusion of left eye CTA of the head and neck did not show any acute abnormality Recommend event monitor and echocardiogram however patient has refused this test multiple times   Essential hypertension Continue current  cardiac medications. Encourage low-sodium diet, less than 2000 mg daily.   Mixed hyperlipidemia Sent Crestor to  pharmacy   Due to patient's behavior and unwillingness to cooperate, he will be dismissed from the practice.     Clotilde Dieter, DO, [facility]  06/30/2022, 12:08 PM Office: [phone] Pager: [phone]

## 2022-07-27 ENCOUNTER — Encounter: Payer: Self-pay | Admitting: Neurology

## 2022-10-27 ENCOUNTER — Other Ambulatory Visit (HOSPITAL_COMMUNITY): Payer: Self-pay

## 2022-10-27 ENCOUNTER — Inpatient Hospital Stay
Admission: AD | Admit: 2022-10-27 | Discharge: 2022-11-14 | Disposition: E | Payer: Self-pay | Source: Other Acute Inpatient Hospital

## 2022-10-27 LAB — VANCOMYCIN, TROUGH: Vancomycin Tr: 12 ug/mL — ABNORMAL LOW (ref 15–20)

## 2022-10-27 LAB — BLOOD GAS, ARTERIAL
Acid-base deficit: 3.9 mmol/L — ABNORMAL HIGH (ref 0.0–2.0)
Bicarbonate: 20.6 mmol/L (ref 20.0–28.0)
O2 Saturation: 99.5 %
Patient temperature: 35.6
pCO2 arterial: 32 mmHg (ref 32–48)
pH, Arterial: 7.41 (ref 7.35–7.45)
pO2, Arterial: 107 mmHg (ref 83–108)

## 2022-10-27 LAB — BRAIN NATRIURETIC PEPTIDE: B Natriuretic Peptide: >4500 pg/mL — ABNORMAL HIGH (ref 0.0–100.0)

## 2022-10-27 NOTE — Progress Notes (Incomplete)
Pulmonary Critical Care Medicine [place]  PROGRESS NOTE     Kevin Dixon  [id number]  DOB: 09-Dec-1950   DOA: 10/27/2022  Referring Physician: Satira Sark, MD  HPI: Kevin Dixon is a 72 y.o. male being followed for ventilator/airway/oxygen weaning Acute on Chronic Respiratory Failure. ***  Medications: Reviewed on Rounds  Physical Exam:  Vitals: ***  Ventilator Settings ***  General: Comfortable at this time Neck: supple Cardiovascular: no malignant arrhythmias Respiratory: *** Skin: no rash seen on limited exam Musculoskeletal: No gross abnormality Psychiatric:unable to assess Neurologic:no involuntary movements         Lab Data:   Basic Metabolic Panel: No results for input(s): "NA", "K", "CL", "CO2", "GLUCOSE", "BUN", "CREATININE", "CALCIUM", "MG", "PHOS" in the last 168 hours.  ABG: Recent Labs  Lab 10/27/22 1208  PHART 7.41  PCO2ART 32  PO2ART 107  HCO3 20.6  O2SAT 99.5    Liver Function Tests: No results for input(s): "AST", "ALT", "ALKPHOS", "BILITOT", "PROT", "ALBUMIN" in the last 168 hours. No results for input(s): "LIPASE", "AMYLASE" in the last 168 hours. No results for input(s): "AMMONIA" in the last 168 hours.  CBC: No results for input(s): "WBC", "NEUTROABS", "HGB", "HCT", "MCV", "PLT" in the last 168 hours.  Cardiac Enzymes: No results for input(s): "CKTOTAL", "CKMB", "CKMBINDEX", "TROPONINI" in the last 168 hours.  BNP (last 3 results) Recent Labs    10/27/22 1252  BNP >4,500.0*    ProBNP (last 3 results) No results for input(s): "PROBNP" in the last 8760 hours.  Radiological Exams: DG Chest Port 1 View  Result Date: 10/27/2022 CLINICAL DATA:  [id number] Respiratory failure ([place]) [id number] PNA (pneumonia) [id number] EXAM: PORTABLE CHEST 1 VIEW COMPARISON:  February 8, 24. FINDINGS: No change from the prior. No new consolidation. Pulmonary nodules and septal  thickening better characterized on recent CT chest. Cardiomediastinal silhouette is unchanged. Gastric tube courses below the diaphragm with the tip in the stomach and the side port below the GE junction. Endotracheal tube tip is approximately 2.3 cm above the carina. IMPRESSION: 1. No change from the prior.  No new consolidation. 2. Pulmonary nodules and septal thickening better characterized on recent CT chest. 3. Lines/tubes detailed above. Electronically Signed   By: Margaretha Sheffield M.D.   On: 10/27/2022 12:40    Assessment/Plan Active Problems:   * No active hospital problems. *   *** *** *** *** ***   I have personally seen and evaluated the patient, evaluated laboratory and imaging results, formulated the assessment and plan and placed orders. The Patient requires high complexity decision making with multiple systems involvement.  Rounds were done with the Respiratory Therapy Director and Staff therapists and discussed with nursing staff also.  Allyne Gee, MD [facility] Pulmonary Critical Care Medicine Sleep Medicine

## 2022-10-28 LAB — CBC WITH DIFFERENTIAL/PLATELET
Abs Immature Granulocytes: 0 10*3/uL (ref 0.00–0.07)
Band Neutrophils: 3 %
Basophils Absolute: 0.1 10*3/uL (ref 0.0–0.1)
Basophils Absolute: 0 10*3/uL (ref 0.0–0.1)
Basophils Relative: 1 %
Basophils Relative: 0 %
Eosinophils Absolute: 0.1 10*3/uL (ref 0.0–0.5)
Eosinophils Absolute: 0 10*3/uL (ref 0.0–0.5)
Eosinophils Relative: 0 %
Eosinophils Relative: 0 %
HCT: 25.6 % — ABNORMAL LOW (ref 39.0–52.0)
HCT: 25 % — ABNORMAL LOW (ref 39.0–52.0)
Hemoglobin: 8.6 g/dL — ABNORMAL LOW (ref 13.0–17.0)
Hemoglobin: 8 g/dL — ABNORMAL LOW (ref 13.0–17.0)
Lymphocytes Relative: 5 %
Lymphocytes Relative: 11 %
Lymphs Abs: 0.6 10*3/uL — ABNORMAL LOW (ref 0.7–4.0)
Lymphs Abs: 1.5 10*3/uL (ref 0.7–4.0)
MCH: 27.4 pg (ref 26.0–34.0)
MCH: 27.1 pg (ref 26.0–34.0)
MCHC: 33.6 g/dL (ref 30.0–36.0)
MCHC: 32 g/dL (ref 30.0–36.0)
MCV: 81.5 fL (ref 80.0–100.0)
MCV: 84.7 fL (ref 80.0–100.0)
Monocytes Absolute: 1.7 10*3/uL — ABNORMAL HIGH (ref 0.1–1.0)
Monocytes Absolute: 2.6 10*3/uL — ABNORMAL HIGH (ref 0.1–1.0)
Monocytes Relative: 16 %
Monocytes Relative: 19 %
Neutro Abs: 7.7 10*3/uL (ref 1.7–7.7)
Neutro Abs: 9.7 10*3/uL — ABNORMAL HIGH (ref 1.7–7.7)
Neutrophils Relative %: 73 %
Neutrophils Relative %: 67 %
Platelets: 15 10*3/uL — CL (ref 150–400)
Platelets: 16 10*3/uL — CL (ref 150–400)
RBC: 3.14 MIL/uL — ABNORMAL LOW (ref 4.22–5.81)
RBC: 2.95 MIL/uL — ABNORMAL LOW (ref 4.22–5.81)
RDW: 20 % — ABNORMAL HIGH (ref 11.5–15.5)
RDW: 20.1 % — ABNORMAL HIGH (ref 11.5–15.5)
WBC: 10.5 10*3/uL (ref 4.0–10.5)
WBC: 13.9 10*3/uL — ABNORMAL HIGH (ref 4.0–10.5)
nRBC: 0.5 % — ABNORMAL HIGH (ref 0.0–0.2)
nRBC: 0.2 % (ref 0.0–0.2)

## 2022-10-28 LAB — URINALYSIS, W/ REFLEX TO CULTURE (INFECTION SUSPECTED)
Bilirubin Urine: NEGATIVE
Glucose, UA: NEGATIVE mg/dL
Ketones, ur: NEGATIVE mg/dL
Leukocytes,Ua: NEGATIVE
Nitrite: NEGATIVE
Protein, ur: NEGATIVE mg/dL
Specific Gravity, Urine: 1.018 (ref 1.005–1.030)
pH: 5 (ref 5.0–8.0)

## 2022-10-28 LAB — COMPREHENSIVE METABOLIC PANEL
ALT: 67 U/L — ABNORMAL HIGH (ref 0–44)
AST: 47 U/L — ABNORMAL HIGH (ref 15–41)
Albumin: 1.7 g/dL — ABNORMAL LOW (ref 3.5–5.0)
Alkaline Phosphatase: 139 U/L — ABNORMAL HIGH (ref 38–126)
Anion gap: 10 (ref 5–15)
BUN: 84 mg/dL — ABNORMAL HIGH (ref 8–23)
CO2: 21 mmol/L — ABNORMAL LOW (ref 22–32)
Calcium: 7.5 mg/dL — ABNORMAL LOW (ref 8.9–10.3)
Chloride: 95 mmol/L — ABNORMAL LOW (ref 98–111)
Creatinine, Ser: 0.84 mg/dL (ref 0.61–1.24)
GFR, Estimated: >60 mL/min (ref >60)
Glucose, Bld: 65 mg/dL — ABNORMAL LOW (ref 70–99)
Potassium: 3.9 mmol/L (ref 3.5–5.1)
Sodium: 126 mmol/L — ABNORMAL LOW (ref 135–145)
Total Bilirubin: 3.8 mg/dL — ABNORMAL HIGH (ref 0.3–1.2)
Total Protein: 5.2 g/dL — ABNORMAL LOW (ref 6.5–8.1)

## 2022-10-28 LAB — DIC (DISSEMINATED INTRAVASCULAR COAGULATION)PANEL
D-Dimer, Quant: 3.59 ug/mL-FEU — ABNORMAL HIGH (ref 0.00–0.50)
Fibrinogen: 251 mg/dL (ref 210–475)
INR: 1.9 — ABNORMAL HIGH (ref 0.8–1.2)
Platelets: 16 10*3/uL — CL (ref 150–400)
Prothrombin Time: 21.4 seconds — ABNORMAL HIGH (ref 11.4–15.2)
Smear Review: NONE SEEN
aPTT: 45 seconds — ABNORMAL HIGH (ref 24–36)

## 2022-10-28 LAB — HEMOGLOBIN A1C
Hgb A1c MFr Bld: 6.2 % — ABNORMAL HIGH (ref 4.8–5.6)
Mean Plasma Glucose: 131 mg/dL

## 2022-10-28 LAB — CORTISOL: Cortisol, Plasma: >100 ug/dL

## 2022-10-28 NOTE — Progress Notes (Incomplete)
Pulmonary Critical Care Medicine [place]  PROGRESS NOTE     Burce Toye  [id number]  DOB: 21-Dec-1950   DOA: 11/09/2022  Referring Physician: Satira Sark, MD  HPI: Kevin Dixon is a 72 y.o. male being followed for ventilator/airway/oxygen weaning Acute on Chronic Respiratory Failure. ***  Medications: Reviewed on Rounds  Physical Exam:  Vitals: ***  Ventilator Settings ***  General: Comfortable at this time Neck: supple Cardiovascular: no malignant arrhythmias Respiratory: *** Skin: no rash seen on limited exam Musculoskeletal: No gross abnormality Psychiatric:unable to assess Neurologic:no involuntary movements         Lab Data:   Basic Metabolic Panel: Recent Labs  Lab 10/28/22 0204  NA 126*  K 3.9  CL 95*  CO2 21*  GLUCOSE 65*  BUN 84*  CREATININE 0.84  CALCIUM 7.5*    ABG: Recent Labs  Lab 11/07/2022 1208  PHART 7.41  PCO2ART 32  PO2ART 107  HCO3 20.6  O2SAT 99.5    Liver Function Tests: Recent Labs  Lab 10/28/22 0204  AST 47*  ALT 67*  ALKPHOS 139*  BILITOT 3.8*  PROT 5.2*  ALBUMIN 1.7*   No results for input(s): "LIPASE", "AMYLASE" in the last 168 hours. No results for input(s): "AMMONIA" in the last 168 hours.  CBC: Recent Labs  Lab 10/28/22 0204 10/28/22 0343  WBC 10.5 13.9*  NEUTROABS 7.7 9.7*  HGB 8.6* 8.0*  HCT 25.6* 25.0*  MCV 81.5 84.7  PLT 15* 16*    Cardiac Enzymes: No results for input(s): "CKTOTAL", "CKMB", "CKMBINDEX", "TROPONINI" in the last 168 hours.  BNP (last 3 results) Recent Labs    10/22/2022 1252  BNP >4,500.0*    ProBNP (last 3 results) No results for input(s): "PROBNP" in the last 8760 hours.  Radiological Exams: DG Chest Port 1 View  Result Date: 10/15/2022 CLINICAL DATA:  [id number] Respiratory failure ([place]) [address] [id number] PNA (pneumonia) [id number] EXAM: PORTABLE CHEST 1 VIEW COMPARISON:  February 8, 24. FINDINGS: No change  from the prior. No new consolidation. Pulmonary nodules and septal thickening better characterized on recent CT chest. Cardiomediastinal silhouette is unchanged. Gastric tube courses below the diaphragm with the tip in the stomach and the side port below the GE junction. Endotracheal tube tip is approximately 2.3 cm above the carina. IMPRESSION: 1. No change from the prior.  No new consolidation. 2. Pulmonary nodules and septal thickening better characterized on recent CT chest. 3. Lines/tubes detailed above. Electronically Signed   By: Margaretha Sheffield M.D.   On: 10/26/2022 12:40    Assessment/Plan Active Problems:   * No active hospital problems. *   *** *** *** *** ***   I have personally seen and evaluated the patient, evaluated laboratory and imaging results, formulated the assessment and plan and placed orders. The Patient requires high complexity decision making with multiple systems involvement.  Rounds were done with the Respiratory Therapy Director and Staff therapists and discussed with nursing staff also.  Allyne Gee, MD [facility] Pulmonary Critical Care Medicine Sleep Medicine

## 2022-10-29 LAB — COMPREHENSIVE METABOLIC PANEL
ALT: 66 U/L — ABNORMAL HIGH (ref 0–44)
AST: 61 U/L — ABNORMAL HIGH (ref 15–41)
Albumin: 1.5 g/dL — ABNORMAL LOW (ref 3.5–5.0)
Alkaline Phosphatase: 153 U/L — ABNORMAL HIGH (ref 38–126)
Anion gap: 10 (ref 5–15)
BUN: 85 mg/dL — ABNORMAL HIGH (ref 8–23)
CO2: 18 mmol/L — ABNORMAL LOW (ref 22–32)
Calcium: 7.1 mg/dL — ABNORMAL LOW (ref 8.9–10.3)
Chloride: 97 mmol/L — ABNORMAL LOW (ref 98–111)
Creatinine, Ser: 0.73 mg/dL (ref 0.61–1.24)
GFR, Estimated: >60 mL/min (ref >60)
Glucose, Bld: 65 mg/dL — ABNORMAL LOW (ref 70–99)
Potassium: 4.9 mmol/L (ref 3.5–5.1)
Sodium: 125 mmol/L — ABNORMAL LOW (ref 135–145)
Total Bilirubin: 3.5 mg/dL — ABNORMAL HIGH (ref 0.3–1.2)
Total Protein: 4.7 g/dL — ABNORMAL LOW (ref 6.5–8.1)

## 2022-10-29 LAB — CBC
HCT: 25.5 % — ABNORMAL LOW (ref 39.0–52.0)
Hemoglobin: 8.1 g/dL — ABNORMAL LOW (ref 13.0–17.0)
MCH: 26.8 pg (ref 26.0–34.0)
MCHC: 31.8 g/dL (ref 30.0–36.0)
MCV: 84.4 fL (ref 80.0–100.0)
Platelets: 18 10*3/uL — CL (ref 150–400)
RBC: 3.02 MIL/uL — ABNORMAL LOW (ref 4.22–5.81)
RDW: 20.3 % — ABNORMAL HIGH (ref 11.5–15.5)
WBC: 9.9 10*3/uL (ref 4.0–10.5)
nRBC: 0.3 % — ABNORMAL HIGH (ref 0.0–0.2)

## 2022-10-29 LAB — HEPARIN INDUCED PLATELET AB (HIT ANTIBODY): Heparin Induced Plt Ab: 0.116 OD (ref 0.000–0.400)

## 2022-10-29 LAB — PROCALCITONIN: Procalcitonin: 12.08 ng/mL

## 2022-10-29 NOTE — Progress Notes (Incomplete)
Pulmonary Critical Care Medicine [place]  PROGRESS NOTE     Kevin Dixon  [id number]  DOB: 30-Jul-1951   DOA: 11/01/2022  Referring Physician: Satira Sark, MD  HPI: Kevin Dixon is a 72 y.o. male being followed for ventilator/airway/oxygen weaning Acute on Chronic Respiratory Failure. ***  Medications: Reviewed on Rounds  Physical Exam:  Vitals: ***  Ventilator Settings ***  General: Comfortable at this time Neck: supple Cardiovascular: no malignant arrhythmias Respiratory: *** Skin: no rash seen on limited exam Musculoskeletal: No gross abnormality Psychiatric:unable to assess Neurologic:no involuntary movements         Lab Data:   Basic Metabolic Panel: Recent Labs  Lab 10/28/22 0204 10/29/22 0047  NA 126* 125*  K 3.9 4.9  CL 95* 97*  CO2 21* 18*  GLUCOSE 65* 65*  BUN 84* 85*  CREATININE 0.84 0.73  CALCIUM 7.5* 7.1*    ABG: Recent Labs  Lab 10/16/2022 1208  PHART 7.41  PCO2ART 32  PO2ART 107  HCO3 20.6  O2SAT 99.5    Liver Function Tests: Recent Labs  Lab 10/28/22 0204 10/29/22 0047  AST 47* 61*  ALT 67* 66*  ALKPHOS 139* 153*  BILITOT 3.8* 3.5*  PROT 5.2* 4.7*  ALBUMIN 1.7* 1.5*   No results for input(s): "LIPASE", "AMYLASE" in the last 168 hours. No results for input(s): "AMMONIA" in the last 168 hours.  CBC: Recent Labs  Lab 10/28/22 0204 10/28/22 0343 10/28/22 1219 10/29/22 0047  WBC 10.5 13.9*  --  9.9  NEUTROABS 7.7 9.7*  --   --   HGB 8.6* 8.0*  --  8.1*  HCT 25.6* 25.0*  --  25.5*  MCV 81.5 84.7  --  84.4  PLT 15* 16* 16* 18*    Cardiac Enzymes: No results for input(s): "CKTOTAL", "CKMB", "CKMBINDEX", "TROPONINI" in the last 168 hours.  BNP (last 3 results) Recent Labs    11/12/2022 1252  BNP >4,500.0*    ProBNP (last 3 results) No results for input(s): "PROBNP" in the last 8760 hours.  Radiological Exams: No results  found.  Assessment/Plan Active Problems:   * No active hospital problems. *   *** *** *** *** ***   I have personally seen and evaluated the patient, evaluated laboratory and imaging results, formulated the assessment and plan and placed orders. The Patient requires high complexity decision making with multiple systems involvement.  Rounds were done with the Respiratory Therapy Director and Staff therapists and discussed with nursing staff also.  Allyne Gee, MD [facility] Pulmonary Critical Care Medicine Sleep Medicine

## 2022-10-31 LAB — PATHOLOGIST SMEAR REVIEW

## 2022-11-01 LAB — CULTURE, BLOOD (ROUTINE X 2)
Culture: NO GROWTH
Culture: NO GROWTH
Special Requests: ADEQUATE
Special Requests: ADEQUATE

## 2022-11-14 DEATH — deceased

## 2022-11-22 ENCOUNTER — Ambulatory Visit: Payer: Medicare Other | Admitting: Neurology

## 2022-12-01 ENCOUNTER — Ambulatory Visit: Payer: Medicare Other | Admitting: Neurology

## 2023-04-15 IMAGING — CT CT CHEST SUPER D W/O CM
3 of 6 series · 16 of 36 positions shown, 18 images · non-contrast
Comparison: 01/09/2019

CLINICAL DATA: Follow-up of right upper lobe pulmonary nodule.
Asymptomatic.

EXAM:
CT CHEST WITHOUT CONTRAST
TECHNIQUE: Multidetector CT imaging of the chest was performed using thin slice
collimation for electromagnetic bronchoscopy planning purposes,
without intravenous contrast.
RADIATION DOSE REDUCTION: This exam was performed according to the
departmental dose-optimization program which includes automated
exposure control, adjustment of the mA and/or kV according to
patient size and/or use of iterative reconstruction technique.

[Series 3: super d-thins · axial · 0.65mm/px · z∈[+1942,+2134]mm · 8 of 310 slices shown, 10 images]
[im 35/310  mediastinal]
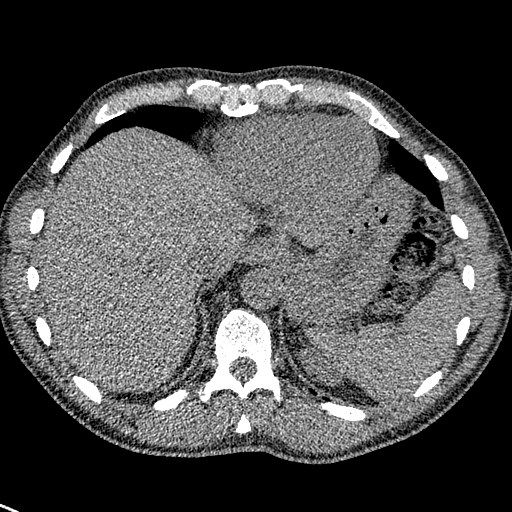
[im 35/310  lung]
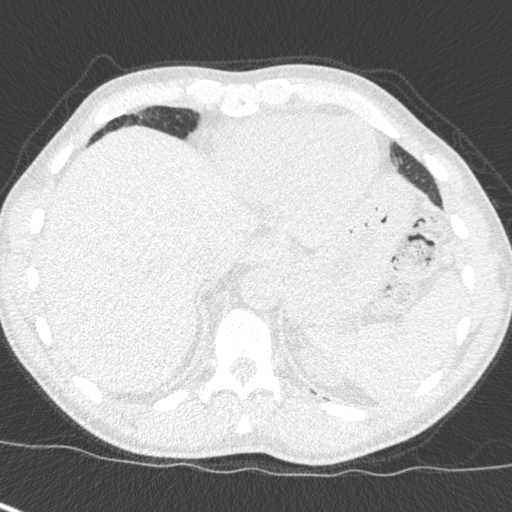
[im 69/310  lung]
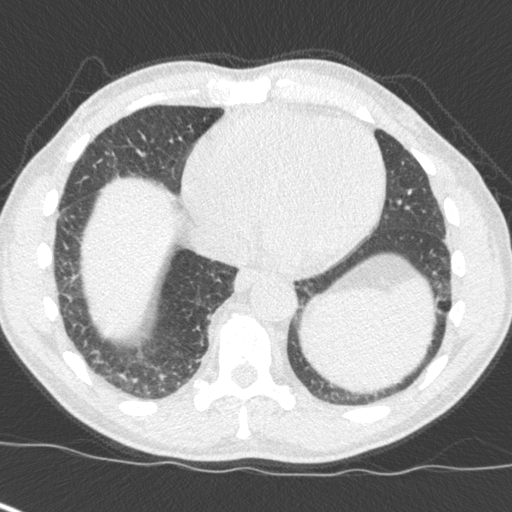
[im 104/310  lung]
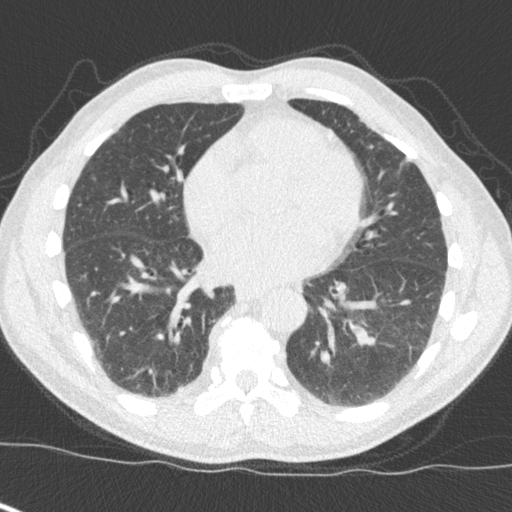
[im 138/310  lung]
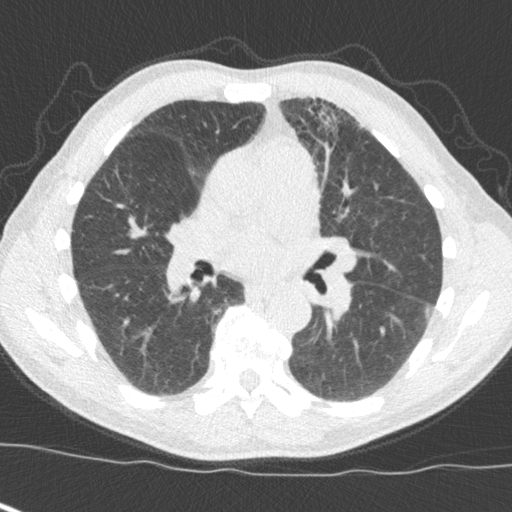
[im 172/310  mediastinal]
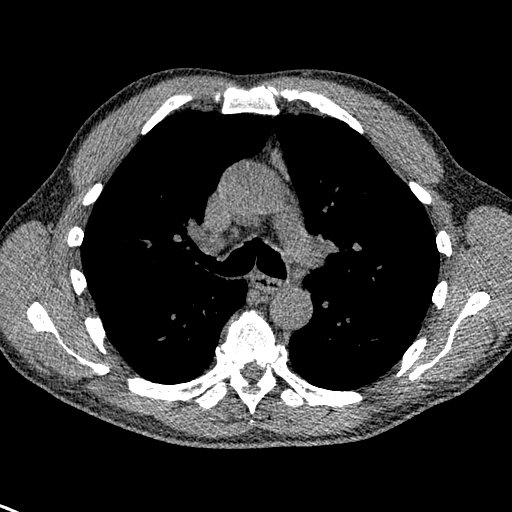
[im 172/310  lung]
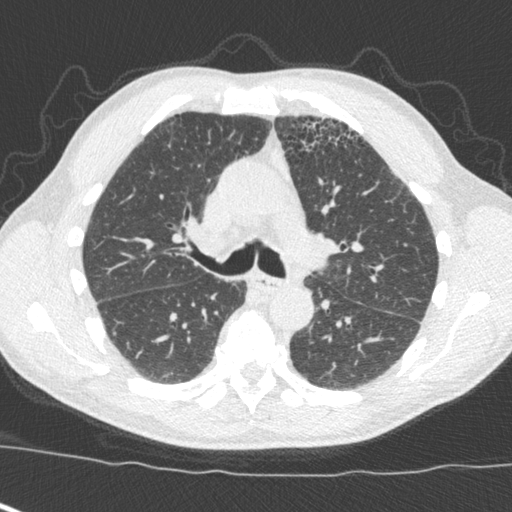
[im 207/310  lung]
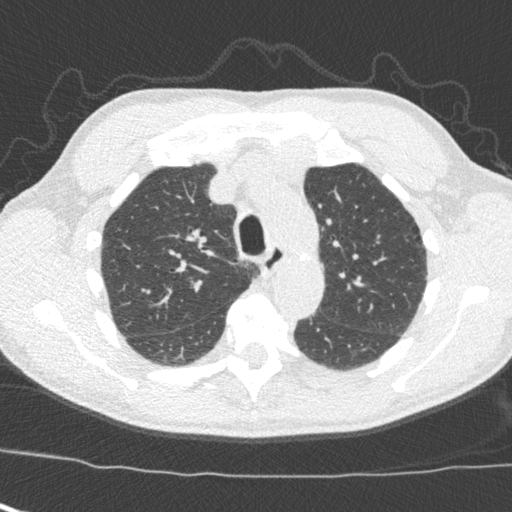
[im 241/310  lung]
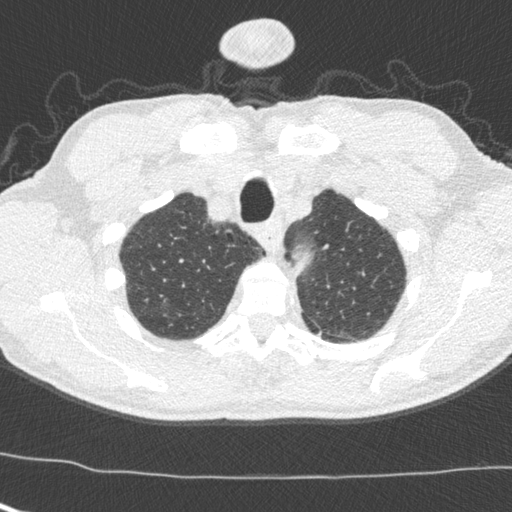
[im 275/310  lung]
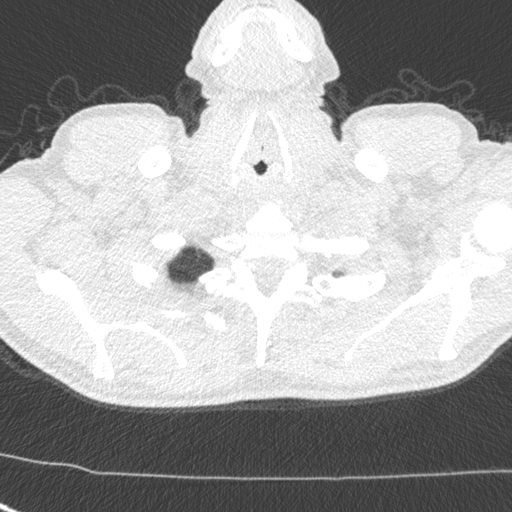

[Series 5: coronal · coronal · 0.51mm/px · 3 of 141 slices shown]
[im 29/141  lung]
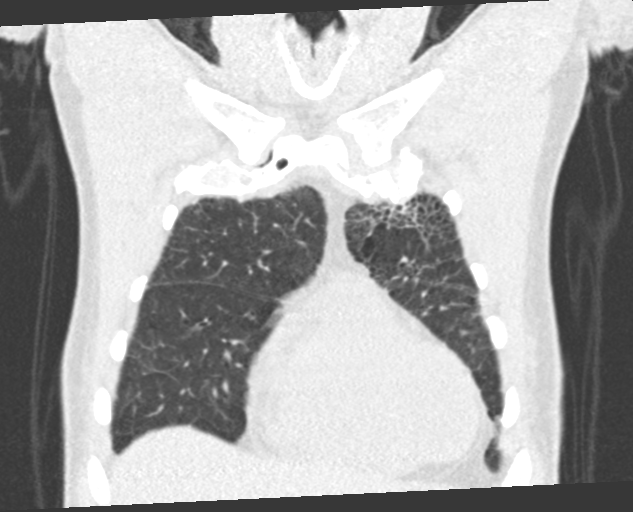
[im 57/141  lung]
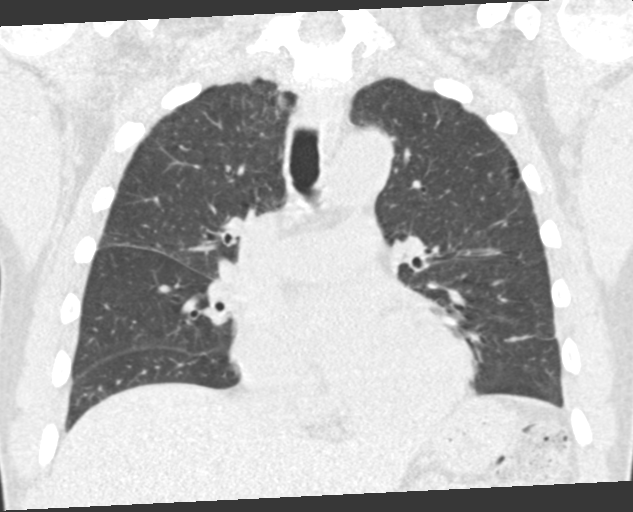
[im 85/141  lung]
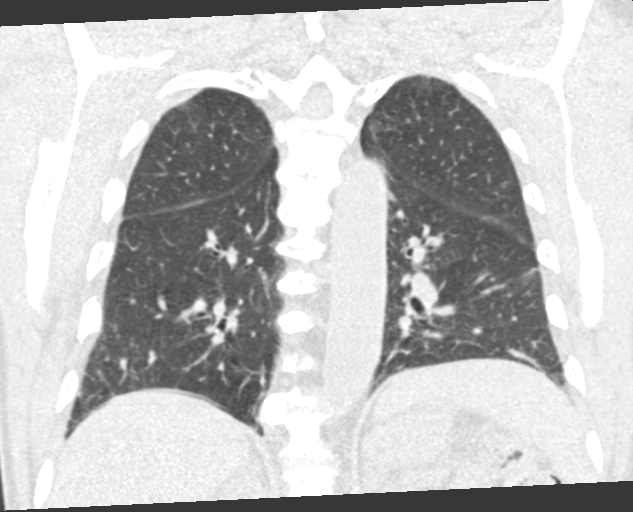

[Series 7: super d lungs · axial · 0.65mm/px · z∈[+1942,+2052]mm · 5 of 310 slices shown]
[im 35/310  lung]
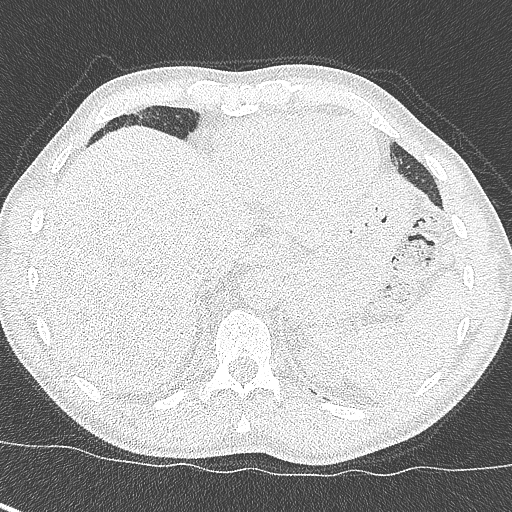
[im 69/310  lung]
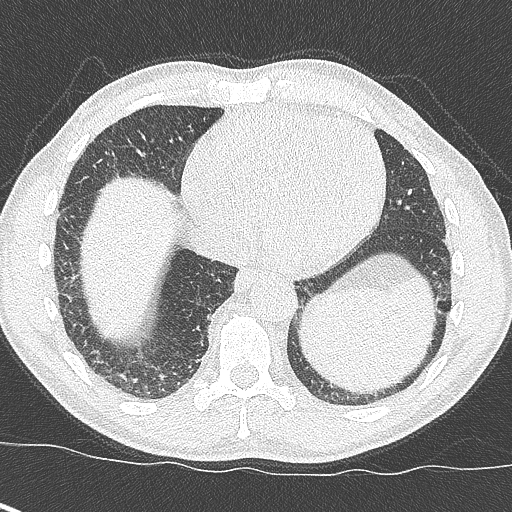
[im 104/310  lung]
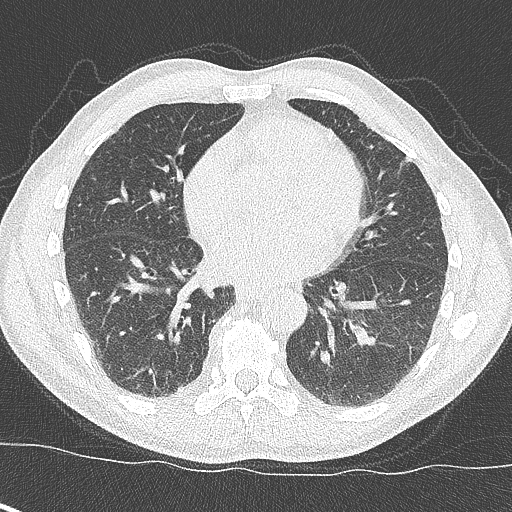
[im 138/310  lung]
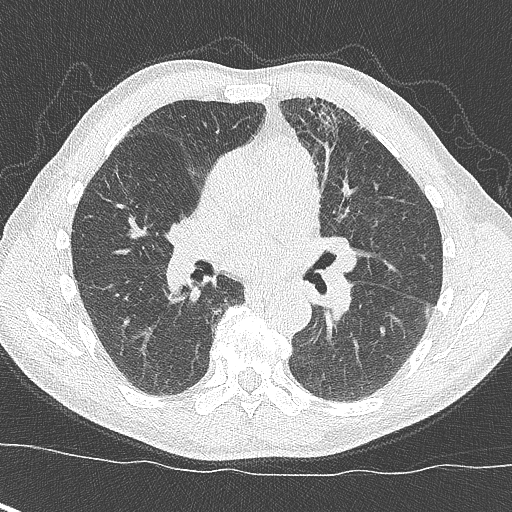
[im 172/310  lung]
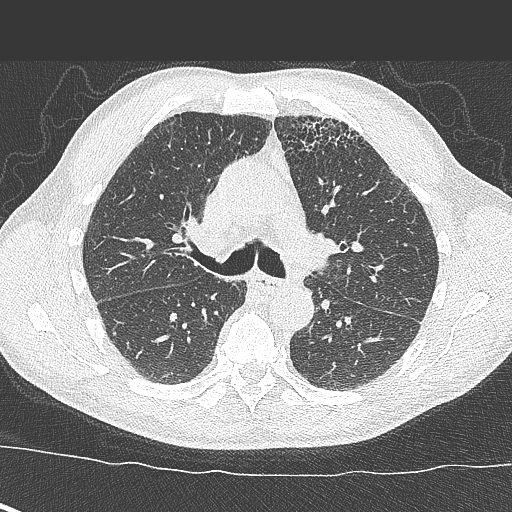

[16 of 36 positions shown; findings below may reference images not displayed]

FINDINGS: Cardiovascular: Aortic atherosclerosis. Mild cardiomegaly, without
pericardial effusion.

Mediastinum/Nodes: No mediastinal or definite hilar adenopathy,
given limitations of unenhanced CT.

Lungs/Pleura: No pleural fluid.  Moderate centrilobular emphysema.

Right apical 6 mm pulmonary nodule on 33/4 is similar to on the
prior and can be presumed benign.

Just lateral to this, a 3 mm nodule on 32/4 is also unchanged and
benign.

Upper Abdomen: Normal imaged portions of the liver, spleen, stomach,
adrenal glands, kidneys.

Musculoskeletal: Remote lower right rib fractures. Thoracic
spondylosis.
IMPRESSION: 1. A right upper lobe 6 mm pulmonary nodule is similar over nearly 3
years, consistent with a benign etiology. A smaller adjacent nodule
is also unchanged and considered benign.
2. Aortic atherosclerosis (LMWYV-J83.3) and emphysema (LMWYV-CYF.5).
